# Patient Record
Sex: Male | Born: 1975 | Race: White | Hispanic: No | Smoking: Current every day smoker
Health system: Southern US, Community
[De-identification: ages and names within clinical notes are randomized; demographics above are authoritative.]

---

## 2003-12-14 ENCOUNTER — Ambulatory Visit (HOSPITAL_COMMUNITY): Admission: RE | Admit: 2003-12-14 | Discharge: 2003-12-14 | Payer: Self-pay | Admitting: Family Medicine

## 2009-02-22 ENCOUNTER — Ambulatory Visit: Payer: Self-pay | Admitting: Internal Medicine

## 2011-11-14 ENCOUNTER — Emergency Department: Payer: Self-pay | Admitting: Emergency Medicine

## 2019-10-16 ENCOUNTER — Other Ambulatory Visit: Payer: Self-pay

## 2019-10-16 ENCOUNTER — Encounter: Payer: Self-pay | Admitting: Emergency Medicine

## 2019-10-16 ENCOUNTER — Emergency Department
Admission: EM | Admit: 2019-10-16 | Discharge: 2019-10-17 | Disposition: A | Discharge status: Home / Self Care | Payer: Self-pay | Attending: Emergency Medicine | Admitting: Emergency Medicine

## 2019-10-16 DIAGNOSIS — R1011 Right upper quadrant pain: Secondary | ICD-10-CM | POA: Insufficient documentation

## 2019-10-16 DIAGNOSIS — Z5321 Procedure and treatment not carried out due to patient leaving prior to being seen by health care provider: Secondary | ICD-10-CM | POA: Insufficient documentation

## 2019-10-16 DIAGNOSIS — H9312 Tinnitus, left ear: Secondary | ICD-10-CM | POA: Insufficient documentation

## 2019-10-16 LAB — URINALYSIS, COMPLETE (UACMP) WITH MICROSCOPIC
Bacteria, UA: NONE SEEN
Bilirubin Urine: NEGATIVE
Glucose, UA: 50 mg/dL — AB
Hgb urine dipstick: NEGATIVE
Ketones, ur: NEGATIVE mg/dL
Leukocytes,Ua: NEGATIVE
Nitrite: NEGATIVE
Protein, ur: NEGATIVE mg/dL
Specific Gravity, Urine: 1.031 — ABNORMAL HIGH (ref 1.005–1.030)
pH: 5 (ref 5.0–8.0)

## 2019-10-16 LAB — COMPREHENSIVE METABOLIC PANEL
ALT: 14 U/L (ref 0–44)
AST: 14 U/L — ABNORMAL LOW (ref 15–41)
Albumin: 4.5 g/dL (ref 3.5–5.0)
Alkaline Phosphatase: 56 U/L (ref 38–126)
Anion gap: 8 (ref 5–15)
BUN: 14 mg/dL (ref 6–20)
CO2: 25 mmol/L (ref 22–32)
Calcium: 9.1 mg/dL (ref 8.9–10.3)
Chloride: 106 mmol/L (ref 98–111)
Creatinine, Ser: 0.92 mg/dL (ref 0.61–1.24)
GFR calc Af Amer: >60 mL/min (ref >60)
GFR calc non Af Amer: >60 mL/min (ref >60)
Glucose, Bld: 92 mg/dL (ref 70–99)
Potassium: 3.6 mmol/L (ref 3.5–5.1)
Sodium: 139 mmol/L (ref 135–145)
Total Bilirubin: 0.5 mg/dL (ref 0.3–1.2)
Total Protein: 7.1 g/dL (ref 6.5–8.1)

## 2019-10-16 LAB — CBC
HCT: 39.7 % (ref 39.0–52.0)
Hemoglobin: 14.1 g/dL (ref 13.0–17.0)
MCH: 29.7 pg (ref 26.0–34.0)
MCHC: 35.5 g/dL (ref 30.0–36.0)
MCV: 83.6 fL (ref 80.0–100.0)
Platelets: 380 10*3/uL (ref 150–400)
RBC: 4.75 MIL/uL (ref 4.22–5.81)
RDW: 12.7 % (ref 11.5–15.5)
WBC: 7.8 10*3/uL (ref 4.0–10.5)
nRBC: 0 % (ref 0.0–0.2)

## 2019-10-16 LAB — LIPASE, BLOOD: Lipase: 37 U/L (ref 11–51)

## 2019-10-16 NOTE — ED Triage Notes (Signed)
Pt reports abd pain to his RUQ and some ringing in his left ear for the last couple of weeks.

## 2019-10-17 NOTE — ED Notes (Signed)
No answer when called several times from lobby 

## 2020-11-15 ENCOUNTER — Emergency Department
Admission: EM | Admit: 2020-11-15 | Discharge: 2020-11-15 | Disposition: A | Discharge status: Home / Self Care | Payer: Self-pay | Attending: Emergency Medicine | Admitting: Emergency Medicine

## 2020-11-15 ENCOUNTER — Emergency Department: Payer: Self-pay

## 2020-11-15 ENCOUNTER — Other Ambulatory Visit: Payer: Self-pay

## 2020-11-15 ENCOUNTER — Encounter: Payer: Self-pay | Admitting: Intensive Care

## 2020-11-15 DIAGNOSIS — F17210 Nicotine dependence, cigarettes, uncomplicated: Secondary | ICD-10-CM | POA: Insufficient documentation

## 2020-11-15 DIAGNOSIS — H938X3 Other specified disorders of ear, bilateral: Secondary | ICD-10-CM | POA: Insufficient documentation

## 2020-11-15 DIAGNOSIS — H61893 Other specified disorders of external ear, bilateral: Secondary | ICD-10-CM

## 2020-11-15 NOTE — ED Triage Notes (Signed)
First nurse Note:   C/O ringing in both ears, feels like there is a piece of metal in ear.  Putting off FRID and NFC Connect signals.  States the [employer] signal makes ears buzz.  Has been seen at [facility] for same recently.  AAOx3.  Skin warm and dry.  Calm and cooperative at this time.

## 2020-11-15 NOTE — ED Triage Notes (Signed)
Patient reports he is here for ear buzzing. He wants MD to look into his ears and get out items in them. He has ziplock bag containing pieces he has gotten out of his ears. He says the items are putting off FRID and NFC connect signals. Reports wifi makes his ears buzz. Seen at [facility] for same. Calm collective

## 2020-11-15 NOTE — ED Notes (Signed)
Pt transported to CT ?
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
## 2020-11-15 NOTE — Discharge Instructions (Signed)
Your physical examination and CT scan today do not reveal any visible foreign body in either ear canal or elsewhere in your scalp.  You should follow-up with an ENT for further evaluation if you continue to have any ringing or other auditory symptoms.  We have also provided you with a referral for mental health evaluation in case this is contributing to your symptoms at all.  Return to the ER for new, worsening, or persistent severe hearing loss, pain, headache, vision changes, hearing voices, or any other new or worsening symptoms that concern you.

## 2021-12-26 IMAGING — CT CT HEAD W/O CM
3 series · 16 of 47 positions shown, 19 images · non-contrast
Comparison: None.

CLINICAL DATA: Your buzzing, mental status change

EXAM:
CT HEAD WITHOUT CONTRAST
TECHNIQUE: Contiguous axial images were obtained from the base of the skull
through the vertex without intravenous contrast.

[Series 2: head wo · axial · 0.42mm/px · z∈[-137,+3]mm · 10 of 34 slices shown, 13 images]
[im 3/34  brain]
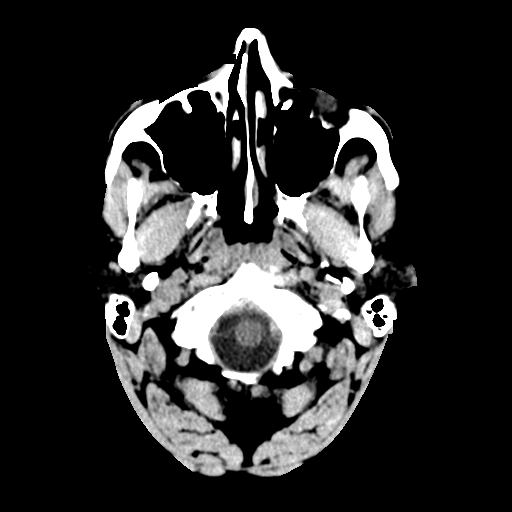
[im 3/34  bone]
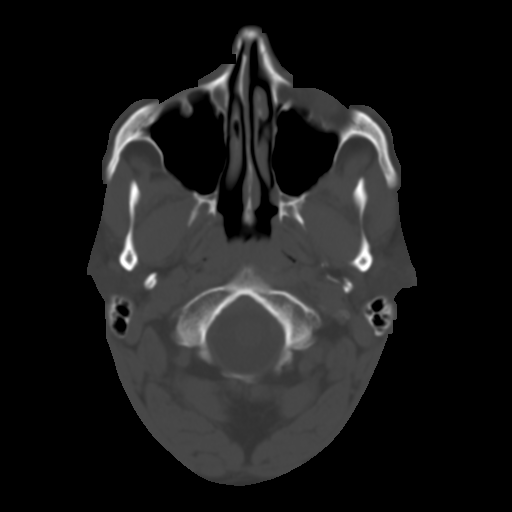
[im 6/34  brain]
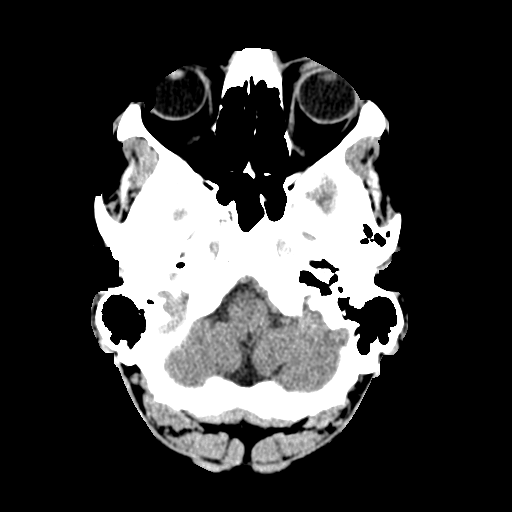
[im 10/34  brain]
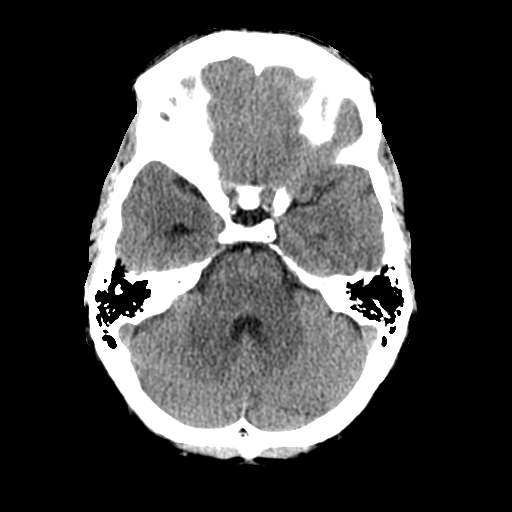
[im 12/34  brain]
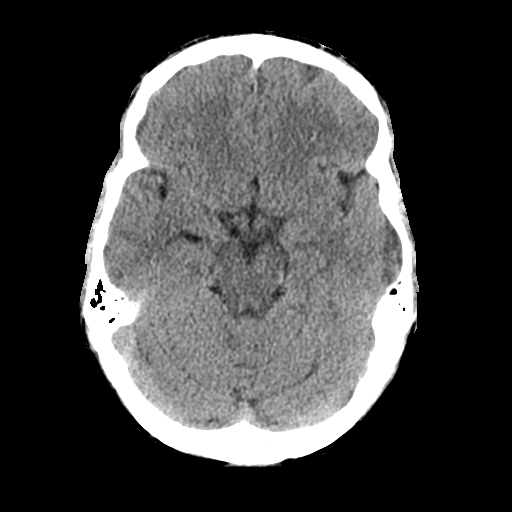
[im 15/34  brain]
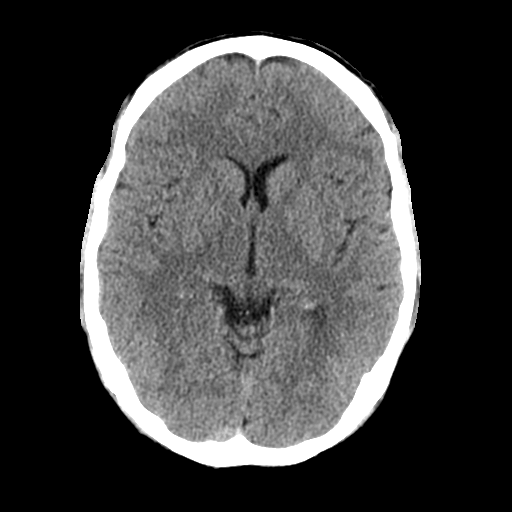
[im 15/34  bone]
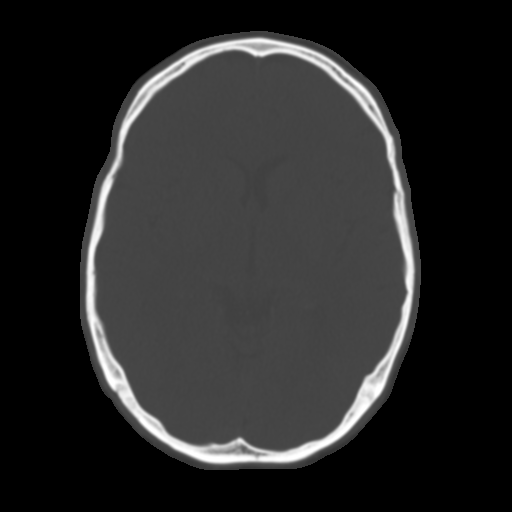
[im 19/34  brain]
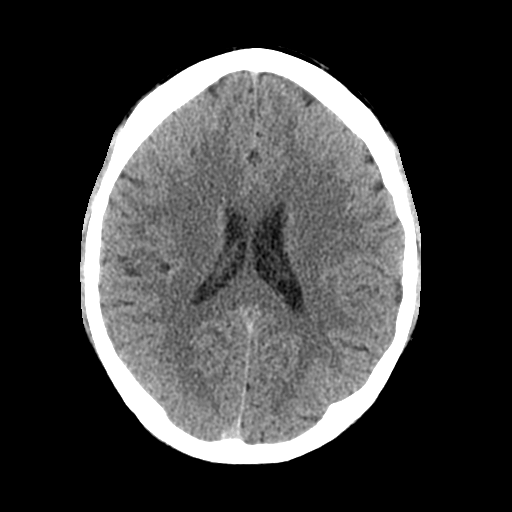
[im 22/34  brain]
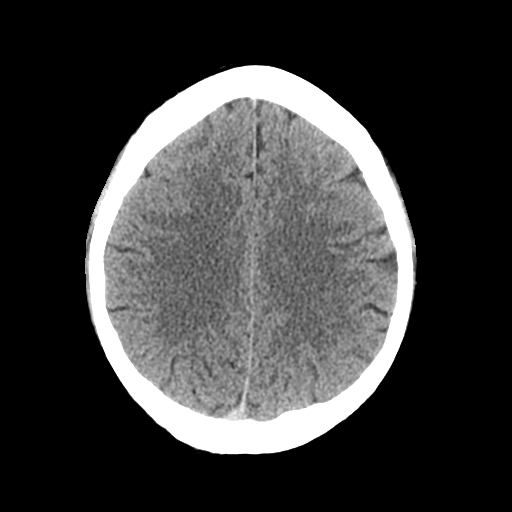
[im 26/34  brain]
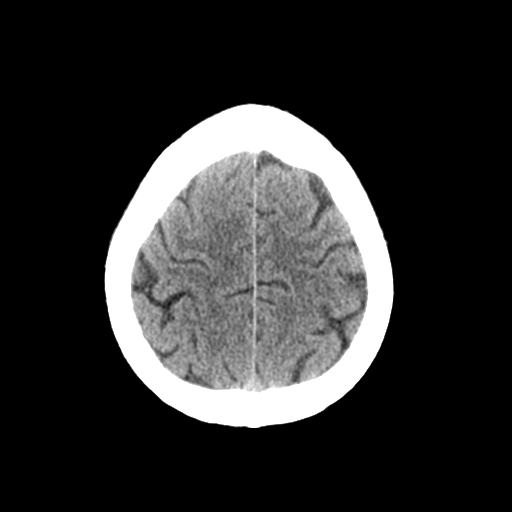
[im 28/34  brain]
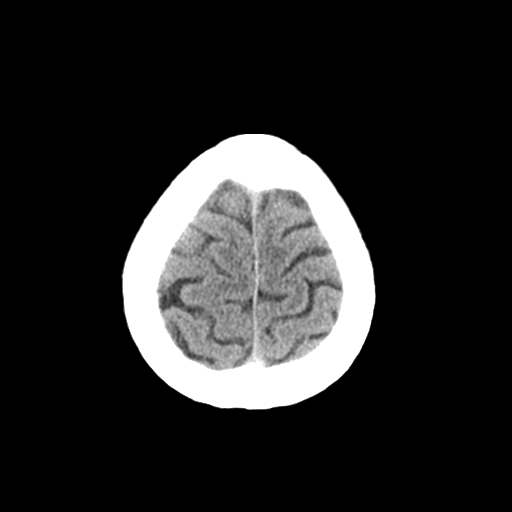
[im 28/34  bone]
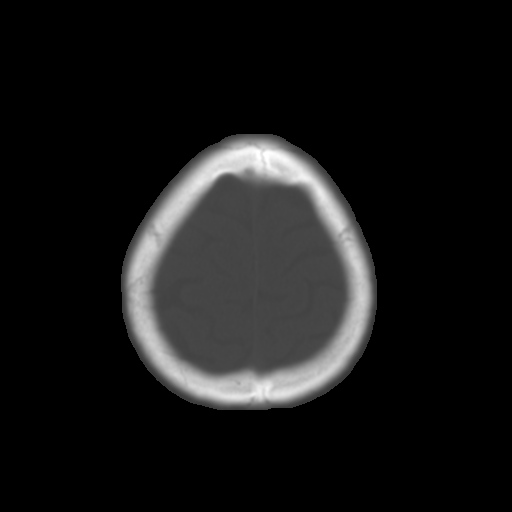
[im 31/34  brain]
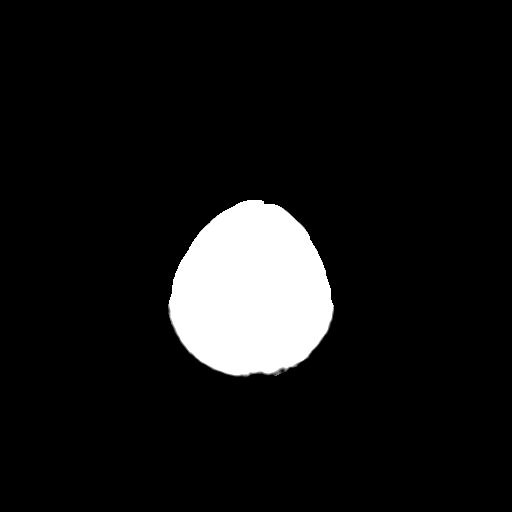

[Series 4: coronal soft tissue · coronal · 0.33mm/px · 3 of 71 slices shown]
[im 24/71  brain]
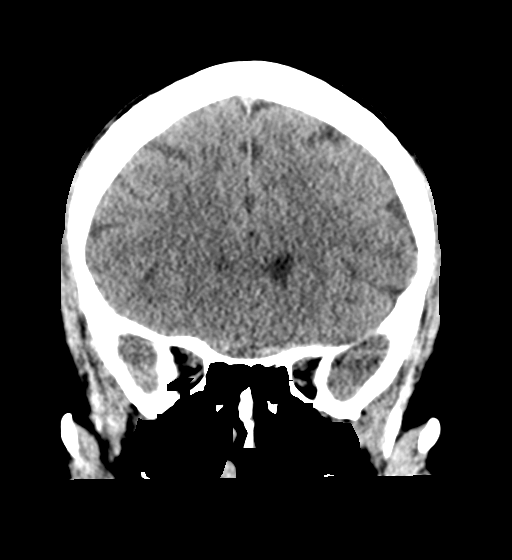
[im 32/71  brain]
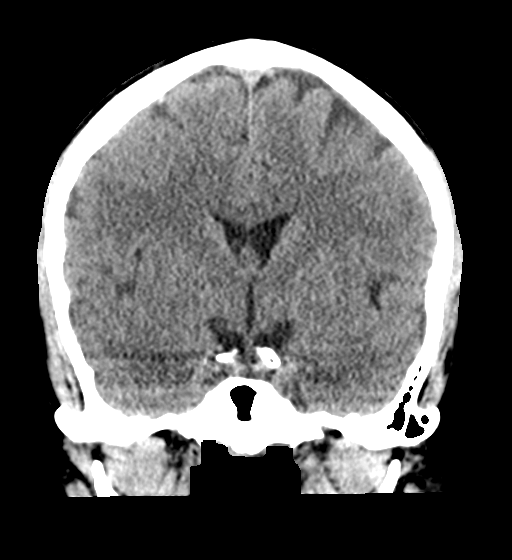
[im 39/71  brain]
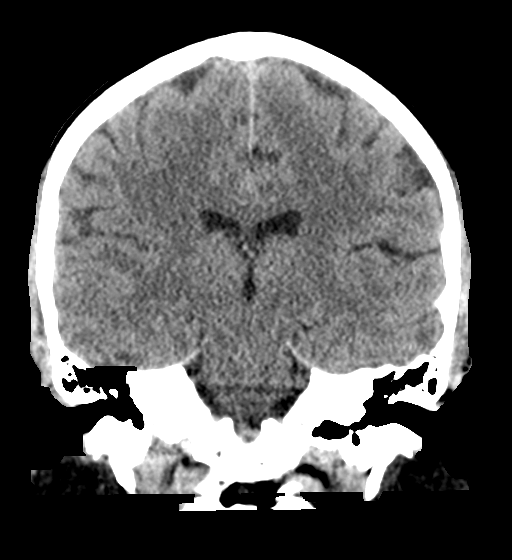

[Series 5: sagittal soft tissue · sagittal · 0.36mm/px · 3 of 57 slices shown]
[im 19/57  brain]
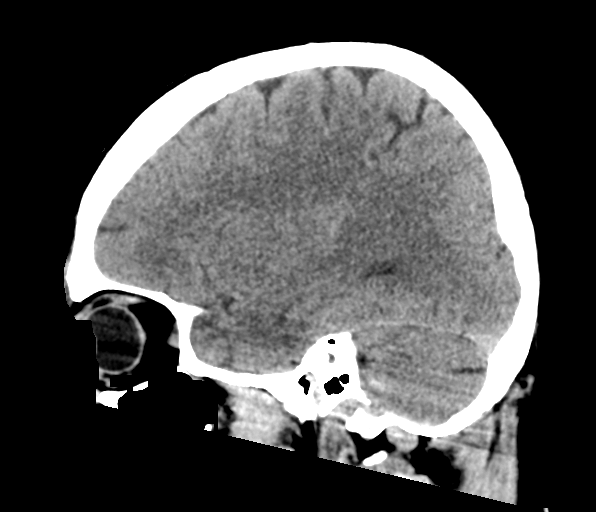
[im 29/57  brain]
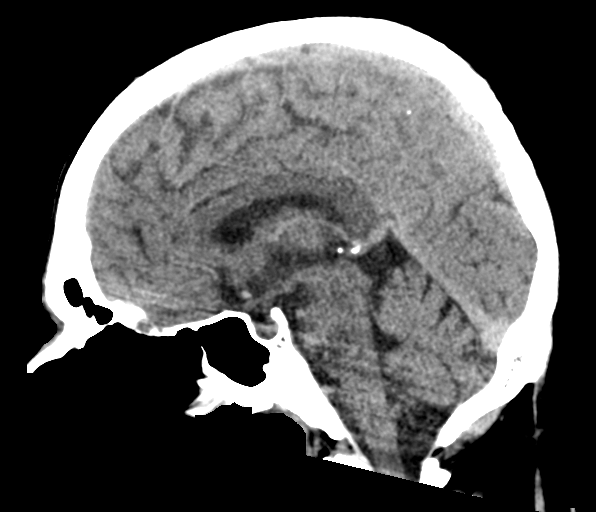
[im 38/57  brain]
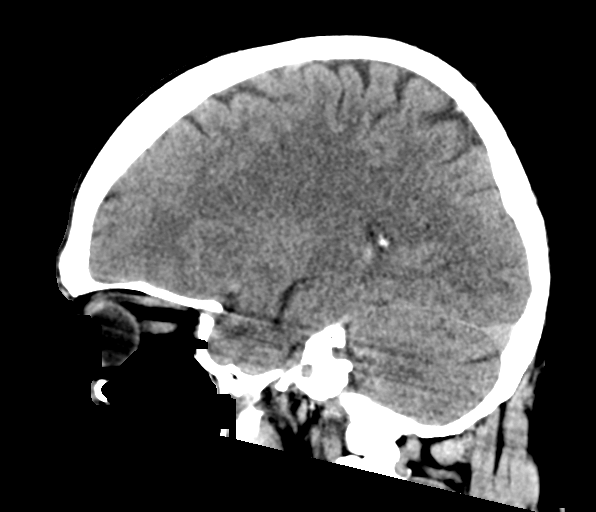

[16 of 47 positions shown; findings below may reference images not displayed]

FINDINGS: Brain: No acute infarct or hemorrhage. Lateral ventricles and
midline structures are unremarkable. No acute extra-axial fluid
collections. No mass effect.

Vascular: No hyperdense vessel or unexpected calcification.

Skull: Normal. Negative for fracture or focal lesion.

Sinuses/Orbits: No acute finding.

Other: There are no abnormalities within the external auditory
canal.
IMPRESSION: No acute intracranial process.

## 2023-03-16 ENCOUNTER — Emergency Department
Admission: EM | Admit: 2023-03-16 | Discharge: 2023-03-17 | Disposition: A | Discharge status: Home / Self Care | Payer: 59 | Attending: Emergency Medicine | Admitting: Emergency Medicine

## 2023-03-16 DIAGNOSIS — W3400XA Accidental discharge from unspecified firearms or gun, initial encounter: Secondary | ICD-10-CM | POA: Diagnosis not present

## 2023-03-16 DIAGNOSIS — S7292XB Unspecified fracture of left femur, initial encounter for open fracture type I or II: Secondary | ICD-10-CM | POA: Diagnosis not present

## 2023-03-16 DIAGNOSIS — S72402B Unspecified fracture of lower end of left femur, initial encounter for open fracture type I or II: Secondary | ICD-10-CM | POA: Insufficient documentation

## 2023-03-16 DIAGNOSIS — S71102A Unspecified open wound, left thigh, initial encounter: Secondary | ICD-10-CM | POA: Diagnosis not present

## 2023-03-16 DIAGNOSIS — S72415A Nondisplaced unspecified condyle fracture of lower end of left femur, initial encounter for closed fracture: Secondary | ICD-10-CM | POA: Diagnosis not present

## 2023-03-16 MED ORDER — CEFAZOLIN SODIUM-DEXTROSE 1-4 GM/50ML-% IV SOLN
1.0000 g | Freq: Once | INTRAVENOUS | Status: AC
  Administered 2023-03-17: 1 g via INTRAVENOUS
  Filled 2023-03-16: qty 50

## 2023-03-16 MED ORDER — SODIUM CHLORIDE 0.9 % IV BOLUS
1000.0000 mL | Freq: Once | INTRAVENOUS | Status: AC
  Administered 2023-03-17: 1000 mL via INTRAVENOUS

## 2023-03-16 MED ORDER — MORPHINE SULFATE (PF) 4 MG/ML IV SOLN
4.0000 mg | Freq: Once | INTRAVENOUS | Status: AC
  Administered 2023-03-17: 4 mg via INTRAVENOUS
  Filled 2023-03-16: qty 1
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
## 2023-03-17 ENCOUNTER — Emergency Department: Payer: 59

## 2023-03-17 ENCOUNTER — Encounter: Payer: Self-pay | Admitting: Emergency Medicine

## 2023-03-17 DIAGNOSIS — S72402B Unspecified fracture of lower end of left femur, initial encounter for open fracture type I or II: Secondary | ICD-10-CM | POA: Diagnosis not present

## 2023-03-17 DIAGNOSIS — S72492A Other fracture of lower end of left femur, initial encounter for closed fracture: Secondary | ICD-10-CM | POA: Diagnosis not present

## 2023-03-17 DIAGNOSIS — S72415A Nondisplaced unspecified condyle fracture of lower end of left femur, initial encounter for closed fracture: Secondary | ICD-10-CM | POA: Diagnosis not present

## 2023-03-17 LAB — CBC
HCT: 42.2 % (ref 39.0–52.0)
Hemoglobin: 14.3 g/dL (ref 13.0–17.0)
MCH: 29.8 pg (ref 26.0–34.0)
MCHC: 33.9 g/dL (ref 30.0–36.0)
MCV: 87.9 fL (ref 80.0–100.0)
Platelets: 422 10*3/uL — ABNORMAL HIGH (ref 150–400)
RBC: 4.8 MIL/uL (ref 4.22–5.81)
RDW: 12.8 % (ref 11.5–15.5)
WBC: 9.6 10*3/uL (ref 4.0–10.5)
nRBC: 0 % (ref 0.0–0.2)

## 2023-03-17 LAB — BASIC METABOLIC PANEL
Anion gap: 10 (ref 5–15)
BUN: 20 mg/dL (ref 6–20)
CO2: 22 mmol/L (ref 22–32)
Calcium: 8.7 mg/dL — ABNORMAL LOW (ref 8.9–10.3)
Chloride: 105 mmol/L (ref 98–111)
Creatinine, Ser: 1.05 mg/dL (ref 0.61–1.24)
GFR, Estimated: >60 mL/min (ref >60)
Glucose, Bld: 118 mg/dL — ABNORMAL HIGH (ref 70–99)
Potassium: 3.9 mmol/L (ref 3.5–5.1)
Sodium: 137 mmol/L (ref 135–145)

## 2023-03-17 LAB — SAMPLE TO BLOOD BANK

## 2023-03-17 MED ORDER — CEPHALEXIN 500 MG PO CAPS: 500.0000 mg | ORAL_CAPSULE | Freq: Three times a day (TID) | ORAL | 0 refills | Status: AC

## 2023-03-17 MED ORDER — OXYCODONE-ACETAMINOPHEN 5-325 MG PO TABS: 1.0000 | ORAL_TABLET | ORAL | 0 refills | Status: AC | PRN

## 2023-03-17 MED ORDER — OXYCODONE-ACETAMINOPHEN 5-325 MG PO TABS
1.0000 | ORAL_TABLET | Freq: Once | ORAL | Status: AC
  Administered 2023-03-17: 1 via ORAL
  Filled 2023-03-17: qty 1

## 2023-03-17 MED ORDER — IOHEXOL 350 MG/ML SOLN
125.0000 mL | Freq: Once | INTRAVENOUS | Status: AC | PRN
  Administered 2023-03-17: 125 mL via INTRAVENOUS

## 2023-03-17 MED ORDER — BACITRACIN ZINC 500 UNIT/GM EX OINT
TOPICAL_OINTMENT | Freq: Once | CUTANEOUS | Status: AC
  Administered 2023-03-17: 1 via TOPICAL
  Filled 2023-03-17: qty 0.9
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
## 2023-03-17 NOTE — Discharge Instructions (Signed)
Take and finish antibiotic as prescribed.  You may take Ibuprofen as needed for pain, Percocet as needed for more severe pain.  Keep wound clean and dry.  Wear knee immobilizer and use crutches until seen by the orthopedic specialist.  Avoid putting weight on your left leg until then.  Return to the ER for worsening symptoms, increased redness/swelling, purulent discharge or other concerns.
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
## 2023-03-23 DIAGNOSIS — W3400XA Accidental discharge from unspecified firearms or gun, initial encounter: Secondary | ICD-10-CM | POA: Diagnosis not present

## 2023-03-23 DIAGNOSIS — S72492A Other fracture of lower end of left femur, initial encounter for closed fracture: Secondary | ICD-10-CM | POA: Diagnosis not present

## 2023-04-23 DIAGNOSIS — S72492D Other fracture of lower end of left femur, subsequent encounter for closed fracture with routine healing: Secondary | ICD-10-CM | POA: Diagnosis not present

## 2023-04-23 DIAGNOSIS — W3400XA Accidental discharge from unspecified firearms or gun, initial encounter: Secondary | ICD-10-CM | POA: Diagnosis not present
